# Patient Record
Sex: Female | Born: 1970 | Race: White | Hispanic: No | Marital: Single | Smoking: Never smoker
Health system: Southern US, Community
[De-identification: ages and names within clinical notes are randomized; demographics above are authoritative.]

---

## 2011-02-19 DIAGNOSIS — Z720 Tobacco use: Secondary | ICD-10-CM | POA: Insufficient documentation

## 2011-02-19 DIAGNOSIS — Z01419 Encounter for gynecological examination (general) (routine) without abnormal findings: Secondary | ICD-10-CM | POA: Insufficient documentation

## 2011-02-19 DIAGNOSIS — L989 Disorder of the skin and subcutaneous tissue, unspecified: Secondary | ICD-10-CM | POA: Insufficient documentation

## 2011-06-03 DIAGNOSIS — G44209 Tension-type headache, unspecified, not intractable: Secondary | ICD-10-CM | POA: Insufficient documentation

## 2011-08-20 DIAGNOSIS — M797 Fibromyalgia: Secondary | ICD-10-CM | POA: Insufficient documentation

## 2011-08-20 DIAGNOSIS — G43709 Chronic migraine without aura, not intractable, without status migrainosus: Secondary | ICD-10-CM | POA: Insufficient documentation

## 2011-12-06 DIAGNOSIS — S0990XA Unspecified injury of head, initial encounter: Secondary | ICD-10-CM | POA: Insufficient documentation

## 2012-06-09 DIAGNOSIS — F988 Other specified behavioral and emotional disorders with onset usually occurring in childhood and adolescence: Secondary | ICD-10-CM | POA: Insufficient documentation

## 2012-06-09 DIAGNOSIS — F319 Bipolar disorder, unspecified: Secondary | ICD-10-CM | POA: Insufficient documentation

## 2012-07-11 DIAGNOSIS — Z124 Encounter for screening for malignant neoplasm of cervix: Secondary | ICD-10-CM | POA: Insufficient documentation

## 2012-07-11 DIAGNOSIS — Z1231 Encounter for screening mammogram for malignant neoplasm of breast: Secondary | ICD-10-CM | POA: Insufficient documentation

## 2012-07-11 DIAGNOSIS — F411 Generalized anxiety disorder: Secondary | ICD-10-CM | POA: Insufficient documentation

## 2012-07-11 DIAGNOSIS — L709 Acne, unspecified: Secondary | ICD-10-CM | POA: Insufficient documentation

## 2012-07-11 DIAGNOSIS — J309 Allergic rhinitis, unspecified: Secondary | ICD-10-CM | POA: Insufficient documentation

## 2012-07-11 DIAGNOSIS — F988 Other specified behavioral and emotional disorders with onset usually occurring in childhood and adolescence: Secondary | ICD-10-CM | POA: Insufficient documentation

## 2012-07-11 DIAGNOSIS — L309 Dermatitis, unspecified: Secondary | ICD-10-CM | POA: Insufficient documentation

## 2012-07-11 DIAGNOSIS — F429 Obsessive-compulsive disorder, unspecified: Secondary | ICD-10-CM | POA: Insufficient documentation

## 2012-07-11 DIAGNOSIS — J029 Acute pharyngitis, unspecified: Secondary | ICD-10-CM | POA: Insufficient documentation

## 2012-07-16 DIAGNOSIS — M129 Arthropathy, unspecified: Secondary | ICD-10-CM | POA: Insufficient documentation

## 2012-07-16 DIAGNOSIS — R413 Other amnesia: Secondary | ICD-10-CM | POA: Insufficient documentation

## 2012-07-16 DIAGNOSIS — G894 Chronic pain syndrome: Secondary | ICD-10-CM | POA: Insufficient documentation

## 2012-07-16 DIAGNOSIS — G4700 Insomnia, unspecified: Secondary | ICD-10-CM | POA: Insufficient documentation

## 2012-07-16 DIAGNOSIS — F341 Dysthymic disorder: Secondary | ICD-10-CM | POA: Insufficient documentation

## 2012-07-16 DIAGNOSIS — G4710 Hypersomnia, unspecified: Secondary | ICD-10-CM | POA: Insufficient documentation

## 2012-07-16 DIAGNOSIS — M791 Myalgia, unspecified site: Secondary | ICD-10-CM | POA: Insufficient documentation

## 2012-07-16 DIAGNOSIS — G8921 Chronic pain due to trauma: Secondary | ICD-10-CM | POA: Insufficient documentation

## 2012-07-16 DIAGNOSIS — R635 Abnormal weight gain: Secondary | ICD-10-CM | POA: Insufficient documentation

## 2012-07-16 DIAGNOSIS — Z639 Problem related to primary support group, unspecified: Secondary | ICD-10-CM | POA: Insufficient documentation

## 2012-07-24 DIAGNOSIS — H60399 Other infective otitis externa, unspecified ear: Secondary | ICD-10-CM | POA: Insufficient documentation

## 2012-07-27 DIAGNOSIS — M25569 Pain in unspecified knee: Secondary | ICD-10-CM | POA: Insufficient documentation

## 2012-11-30 DIAGNOSIS — F411 Generalized anxiety disorder: Secondary | ICD-10-CM | POA: Insufficient documentation

## 2012-11-30 DIAGNOSIS — F4541 Pain disorder exclusively related to psychological factors: Secondary | ICD-10-CM | POA: Insufficient documentation

## 2012-11-30 DIAGNOSIS — F39 Unspecified mood [affective] disorder: Secondary | ICD-10-CM | POA: Insufficient documentation

## 2013-05-03 DIAGNOSIS — G5600 Carpal tunnel syndrome, unspecified upper limb: Secondary | ICD-10-CM | POA: Insufficient documentation

## 2013-05-03 DIAGNOSIS — M79603 Pain in arm, unspecified: Secondary | ICD-10-CM | POA: Insufficient documentation

## 2013-06-25 DIAGNOSIS — M5030 Other cervical disc degeneration, unspecified cervical region: Secondary | ICD-10-CM | POA: Insufficient documentation

## 2013-06-25 DIAGNOSIS — M5412 Radiculopathy, cervical region: Secondary | ICD-10-CM | POA: Insufficient documentation

## 2013-07-22 DIAGNOSIS — M797 Fibromyalgia: Secondary | ICD-10-CM | POA: Insufficient documentation

## 2013-09-13 DIAGNOSIS — M5412 Radiculopathy, cervical region: Secondary | ICD-10-CM | POA: Insufficient documentation

## 2014-09-12 DIAGNOSIS — S13100A Subluxation of unspecified cervical vertebrae, initial encounter: Secondary | ICD-10-CM | POA: Insufficient documentation

## 2014-11-29 DIAGNOSIS — F4310 Post-traumatic stress disorder, unspecified: Secondary | ICD-10-CM | POA: Insufficient documentation

## 2014-12-07 DIAGNOSIS — M9909 Segmental and somatic dysfunction of abdomen and other regions: Secondary | ICD-10-CM | POA: Insufficient documentation

## 2015-06-28 DIAGNOSIS — G43009 Migraine without aura, not intractable, without status migrainosus: Secondary | ICD-10-CM | POA: Insufficient documentation

## 2015-08-10 ENCOUNTER — Ambulatory Visit (INDEPENDENT_AMBULATORY_CARE_PROVIDER_SITE_OTHER): Payer: Medicaid Other | Admitting: Medical

## 2015-08-10 ENCOUNTER — Encounter (INDEPENDENT_AMBULATORY_CARE_PROVIDER_SITE_OTHER): Payer: Self-pay

## 2015-08-10 ENCOUNTER — Encounter (HOSPITAL_COMMUNITY): Payer: Self-pay | Admitting: Medical

## 2015-08-10 VITALS — BP 114/64 | HR 85 | Ht 63.0 in | Wt 116.0 lb

## 2015-08-10 DIAGNOSIS — Z8659 Personal history of other mental and behavioral disorders: Secondary | ICD-10-CM | POA: Insufficient documentation

## 2015-08-10 DIAGNOSIS — Z639 Problem related to primary support group, unspecified: Secondary | ICD-10-CM | POA: Insufficient documentation

## 2015-08-10 DIAGNOSIS — F418 Other specified anxiety disorders: Secondary | ICD-10-CM

## 2015-08-10 DIAGNOSIS — F1010 Alcohol abuse, uncomplicated: Secondary | ICD-10-CM

## 2015-08-10 DIAGNOSIS — F1011 Alcohol abuse, in remission: Secondary | ICD-10-CM

## 2015-08-10 DIAGNOSIS — Z62811 Personal history of psychological abuse in childhood: Secondary | ICD-10-CM

## 2015-08-10 DIAGNOSIS — F4312 Post-traumatic stress disorder, chronic: Secondary | ICD-10-CM

## 2015-08-10 DIAGNOSIS — R6889 Other general symptoms and signs: Secondary | ICD-10-CM | POA: Insufficient documentation

## 2015-08-10 DIAGNOSIS — G894 Chronic pain syndrome: Secondary | ICD-10-CM

## 2015-08-10 DIAGNOSIS — F4310 Post-traumatic stress disorder, unspecified: Secondary | ICD-10-CM

## 2015-08-10 DIAGNOSIS — Z79899 Other long term (current) drug therapy: Secondary | ICD-10-CM

## 2015-08-10 DIAGNOSIS — F988 Other specified behavioral and emotional disorders with onset usually occurring in childhood and adolescence: Secondary | ICD-10-CM

## 2015-08-10 NOTE — Progress Notes (Signed)
Psychiatric Initial Adult Assessment   Patient Identification: Ana Hicks MRN:  [id number] Date of Evaluation:  08/10/2015 Referral Source: Zollie Peeon Bulla PA Chief Complaint:  Wants genetic testing Chief Complaint    Establish Care; Family Problem; Alcohol Problem; Trauma; Stress; Conversion reactions; Multiple somatic complaints; Benzodiazepene dependency     Visit Diagnosis: Patient Active Problem List  Diagnosis  . Brachial neuritis or radiculitis  . Degeneration of cervical intervertebral disc  . Acne  . Acute pharyngitis  . Allergic rhinitis  . Anxiety state  . Attention deficit hyperactivity disorder (ADHD)  . Bipolar affective disorder (RAF-HCC)  . Carpal tunnel syndrome  . Encounter for screening for malignant neoplasm of cervix  . Arthropathy  . Headache  . Chronic pain syndrome  . Dysthymic disorder  . Tobacco use disorder  . Dermatitis  . Encounter for routine gynecological examination  . Episodic mood disorder (RAF-HCC)  . Organic hypersomnia  . Myalgia and myositis  . Generalized anxiety disorder  . Head injury  . Insomnia  . Pain in joint, lower leg  . Memory loss  . Obsessive-compulsive disorder  . Infective otitis externa  . Pain disorders related to psychological factors  . Chronic pain due to trauma  . Nonallopathic lesion of cervical region  . Nonallopathic lesion of lumbar region  . Disorder of skin and subcutaneous tissue  . Family circumstance  . Tension type headache  . Visit for screening mammogram  . Abnormal weight gain  . Pain in joint, forearm  . Myofascial pain syndrome  . Nonallopathic lesion of thoracic region  . Left cervical radiculopathy with cervical disc degeneration  . Fibromyalgia    History of Present Illness: 45 yo WF seeking genetic testing for her "Bipolar DO" .Staes she has been on "many medications" and had side effects. Her daughter has had testing so "I am familiar with it". [facility] at [facility] 10 years and is currently seeing Psych provider there Yvette Rackegina Mozingo NP. Saysshe requested Genetic testing but procedure is m not done there.       Current Medications Prescription Sig. Disp. Refills Start Date End Date Status  busPIRone (BUSPAR) 10 mg tablet  Take 10 mg by mouth 3 (three) times a day.      Active  topiramate (TOPAMAX) 25 MG capsule  Take 75 mg by mouth daily.     Active  diazepam (VALIUM) 5 mg tablet  1/2 day and One hs   07/19/2013  Active  gabapentin (NEURONTIN) 600 mg tablet  2 three times daily as needed   07/15/2013  Active  omeprazole (PRILOSEC) 40 mg capsule  Take 40 mg by mouth.     Active  ondansetron (ZOFRAN) 4 MG tablet  Take 4 mg by mouth.     Active  zolpidem tartrate (AMBIEN) 10 mg tablet  one hs   07/19/2013  Active  cyclobenzaprine (FLEXERIL) 5 mg tablet  Up to three times a day as needed   07/15/2013  Active  topiramate (TOPAMAX) 100 MG tablet  Take 100 mg by mouth.     Active  Dietary Management Product (ENLYTE) CAPS  Take by mouth.   10/19/2013  Active  traZODone (DESYREL) 100 MG tablet  Take by mouth.   10/19/2013  Active  ranitidine (ZANTAC) 300 MG capsule  Take 300 mg by mouth every evening.     Active  baclofen (LIORESAL) 10 mg tablet  TAKE ONE (1) TABLET BY MOUTH 3 TIMES DAILY  45 tablet  2 11/30/2014  Active  modafinil (PROVIGIL) 200 MG tablet  1 in AM and at noon as needed   10/27/2014  Active  DULoxetine HCl (CYMBALTA) 30 mg capsule  Take by mouth.   01/26/2015 01/26/2016 Active  buPROPion HCl (WELLBUTRIN XL) 150 mg 24 hr tablet  Take by mouth.   03/07/2015 03/06/2016 Active  diazepam (VALIUM) 5 mg tablet  Take one tablet twice daily.   03/07/2015  Active  Active Problems Problem Noted Date  Bipolar disorder (*) 06/09/2012  ADD (attention deficit disorder) 06/09/2012  Chronic migraine without aura, without mention of intractable migraine without mention of status  migrainosus 08/20/2011  Fibromyalgia      Associated Signs/Symptoms: Depression Symptoms:  depressed mood, feelings of worthlessness/guilt, difficulty concentrating, loss of energy/fatigue, disturbed sleep, (Hypo) Manic Symptoms:  Distractibility, Impulsivity, Irritable Mood, Labiality of Mood, Anxiety Symptoms:  Excessive Worry, Specific Phobias, Psychotic Symptoms:  NONE PTSD Symptoms:  Conversion rea;multiple somatic complaints;Victim role ("they riuned my life"ction  Past Psychiatric History: Anxiety disorder (300.00) .Attention deficit disorder with hyperactivity (314.01)  Bipolar disorder (296.80) . Episodic mood disorder (296.90) . Generalized anxiety disorder (300.02)  Obsessive compulsive disorder (300.3) . Pain disorder associated with psychological and physical factors (307.89)  Segmental and somatic dysfunction of cervical region (739.1) . Segmental and somatic dysfunction of lumbar region (739.3) . Bipolar disorder (296.80) Previous Psychotropic Medications: Yes .   Substance Abuse History in the last 12 months:  No.  Consequences of Substance Abuse: none recently-see 2012 Care everywhere note  Past Medical History:  Past Medical History  Diagnosis Date  . Chronic migraine  . Insomnia, unspecified  . Headache(784.0)  . Dysthymic disorder  . Unspecified family circumstance  Stress at home  . Arthropathy, unspecified, other specified sites  . Degeneration of cervical intervertebral disc  . Myalgia and myositis, unspecified  . Memory loss  . Chronic pain due to trauma  . Chronic pain syndrome  . Organic hypersomnia, unspecified  . Acid reflux   Past Surgical History Past Surgical History  Procedure Laterality Date  . Cesarean section  x 2   Family Psychiatric History:  : The patient describes a family history of bipolar disorder and attention deficit/hyperactivity disorder (ADHD), including a mother with bipolar disorder, maternal uncle who  died from suicide at age 33, maternal grandfather with suspected bipolar disorder, and a maternal grandmother who was described as paranoid. She has a child with attention deficit/hyperactivity disorder (ADHD), and a child with bipolar disorder.  Family History:  Family History 1. Family history of Depression  Social History:  Social History   Social History  . Marital Status: Single    Spouse Name: N/A  . Number of Children: N/A  . Years of Education: N/A   Social History Main Topics  . Smoking status: Never Smoker   . Smokeless tobacco: None  . Alcohol Use: No  . Drug Use: No  . Sexual Activity: Not Asked   Other Topics Concern  . None   Social History Narrative  . SOCIAL HISTORY: The patient has been married for approximately 20 years. She states she married the first person she fell in love with. She does not know whether she is currently in love with him, that the recent affair was impulsive and not planned. She does not appear to show remorse for her current marital status. She drinks occasional alcohol, about one drink a day. She uses cigarettes, less than 10 cigarettes a day. She denies illicit substances.  She does have a history of using clonazepam as prescription.  Denied: History of Alcohol Use  Denied: History of Drug Use  Exercising Regularly  Marital History - Divorced (V61.03)  Mental Disability:  Never A Smoker  Physical Disability:  Unemployed     Additional Social History: see narrative  Allergies:   Allergies  Allergen Reactions  . Penicillin G Anaphylaxis  . Penicillins Rash    Metabolic Disorder Labs: See labs in Care Everywhere     Current Medications: Current Outpatient Prescriptions  Medication Sig Dispense Refill  . Armodafinil 250 MG tablet     . busPIRone (BUSPAR) 10 MG tablet Take by mouth.    . cyclobenzaprine (FLEXERIL) 5 MG tablet Take 5 mg by mouth.    . diazepam (VALIUM) 5 MG tablet Take 5 mg by mouth at  bedtime.    . DULoxetine (CYMBALTA) 30 MG capsule Take 30 mg by mouth daily.    . fluticasone (FLONASE) 50 MCG/ACT nasal spray Place 1 spray into both nostrils daily.    Marland Kitchen gabapentin (NEURONTIN) 300 MG capsule Take 300 mg by mouth.    . lurasidone (LATUDA) 20 MG TABS tablet     . omeprazole (PRILOSEC) 40 MG capsule Take by mouth.    . ranitidine ([facility] RANITIDINE) 75 MG tablet Take 75 mg by mouth.    . ranitidine (ZANTAC) 300 MG tablet Take 300 mg by mouth.    . topiramate (TOPAMAX) 100 MG tablet Take 300 mg by mouth.     No current facility-administered medications for this visit.    Neurologic: Headache: Yes Seizure: Negative Paresthesias:With neck issues 2015  Musculoskeletal: Strength & Muscle Tone: within normal limits Gait & Station: normal Patient leans: N/A  Psychiatric Specialty Exam: Review of Systems  Constitutional: Positive for weight loss and malaise/fatigue. Negative for fever, chills and diaphoresis.  HENT: Negative for congestion, ear discharge, ear pain, hearing loss, nosebleeds, sore throat and tinnitus.   Eyes: Negative for blurred vision, double vision, photophobia, pain, discharge and redness.  Respiratory: Positive for cough and wheezing. Negative for hemoptysis, sputum production, shortness of breath and stridor.   Cardiovascular: Negative for chest pain, palpitations, orthopnea, claudication, leg swelling and PND.  Gastrointestinal: Positive for heartburn and abdominal pain. Negative for nausea, vomiting, diarrhea, constipation, blood in stool and melena.  Genitourinary: Negative for dysuria, urgency, frequency, hematuria and flank pain.  Musculoskeletal: Positive for myalgias, back pain, joint pain and neck pain. Negative for falls.  Skin: Negative for itching and rash.  Neurological: Positive for tingling, tremors, weakness and headaches. Negative for dizziness, sensory change, speech change, focal weakness and loss of consciousness.  Endo/Heme/Allergies:  Negative for environmental allergies and polydipsia. Does not bruise/bleed easily.  Psychiatric/Behavioral: Positive for depression and hallucinations. Negative for suicidal ideas and substance abuse. The patient is nervous/anxious and has insomnia.     Blood pressure 114/64, pulse 85, height 5\' 3"  (1.6 m), weight 116 lb (52.617 kg), last menstrual period 08/08/2015, SpO2 96 %.Body mass index is 20.55 kg/(m^2).  General Appearance: Well Groomed and asthenic  Eye Contact:  Good  Speech:  Clear and Coherent  Volume:  Normal  Mood:  Euthymic  Affect:  Congruent  Thought Process:  Goal Directed  Orientation:  Full (Time, Place, and Person)  Thought Content:  Logical, Illogical, Delusions, Obsessions and Rumination  Suicidal Thoughts:  No  Homicidal Thoughts:  No  Memory:  Negative  Judgement:  Impaired  Insight:  Lacking  Psychomotor Activity:  Normal and Restlessness  Concentration:  Concentration: Good and Attention Span: Good for visit  Recall:  Good  Fund of Knowledge:Fair  Language: Fair  Akathisia:  Negative  Handed:  Right  AIMS (if indicated):  NA  Assets:  Financial Resources/Insurance Resilience Transportation Vocational/Educational  ADL's:  Intact  Cognition: Impaired,  Severe  Sleep:  Impaired by history    Treatment Plan Summary: Reviewed pt record and reason for visit-not appropriate for her to change providers and she doesnt want to under current circumstances-she only wants Gene testing and transfer if she b cant "get on the right medications".Will perform [facility] and [facility] and the patient   Maryjean MornCharles Amarrion Pastorino, PA-C 7/6/20172:15 PM

## 2015-08-31 ENCOUNTER — Telehealth (HOSPITAL_COMMUNITY): Payer: Self-pay | Admitting: *Deleted

## 2015-08-31 NOTE — Telephone Encounter (Signed)
Received results from [facility]. Per Maryjean Morn, PA, results were faxed to pt's pcp, Dr. Malvin Johns at [phone].

## 2016-01-04 ENCOUNTER — Other Ambulatory Visit (HOSPITAL_BASED_OUTPATIENT_CLINIC_OR_DEPARTMENT_OTHER): Payer: Self-pay | Admitting: Obstetrics and Gynecology

## 2016-01-04 DIAGNOSIS — Z1231 Encounter for screening mammogram for malignant neoplasm of breast: Secondary | ICD-10-CM

## 2016-01-06 ENCOUNTER — Ambulatory Visit (HOSPITAL_BASED_OUTPATIENT_CLINIC_OR_DEPARTMENT_OTHER)
Admission: RE | Admit: 2016-01-06 | Discharge: 2016-01-06 | Disposition: A | Discharge status: Home / Self Care | Payer: Medicaid Other | Source: Ambulatory Visit | Attending: Obstetrics and Gynecology | Admitting: Obstetrics and Gynecology

## 2016-01-06 DIAGNOSIS — Z1231 Encounter for screening mammogram for malignant neoplasm of breast: Secondary | ICD-10-CM | POA: Diagnosis present

## 2016-01-06 IMAGING — MG 2D DIGITAL SCREENING BILATERAL MAMMOGRAM WITH CAD AND ADJUNCT TO
6 of 10 series · 6 of 30 positions shown · non-contrast
Comparison: Previous exam(s).

CLINICAL DATA: Screening.

EXAM:
2D DIGITAL SCREENING BILATERAL MAMMOGRAM WITH CAD AND ADJUNCT TOMO

[L MLO]
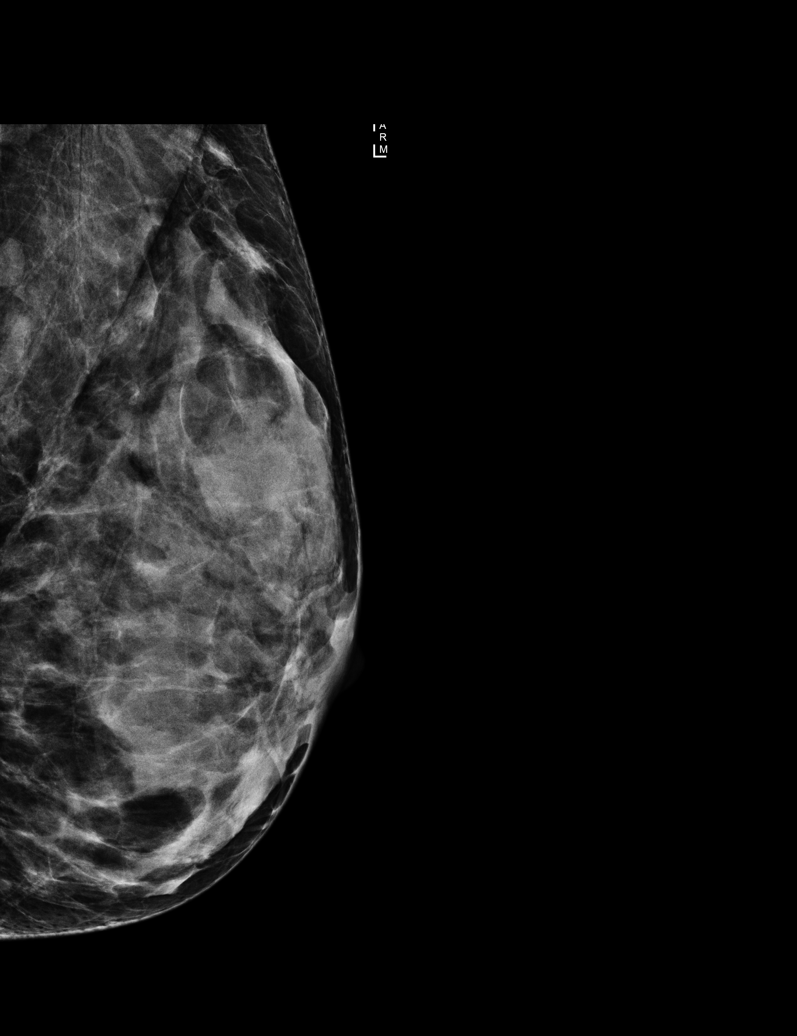

[R MLO (1 of 2)]
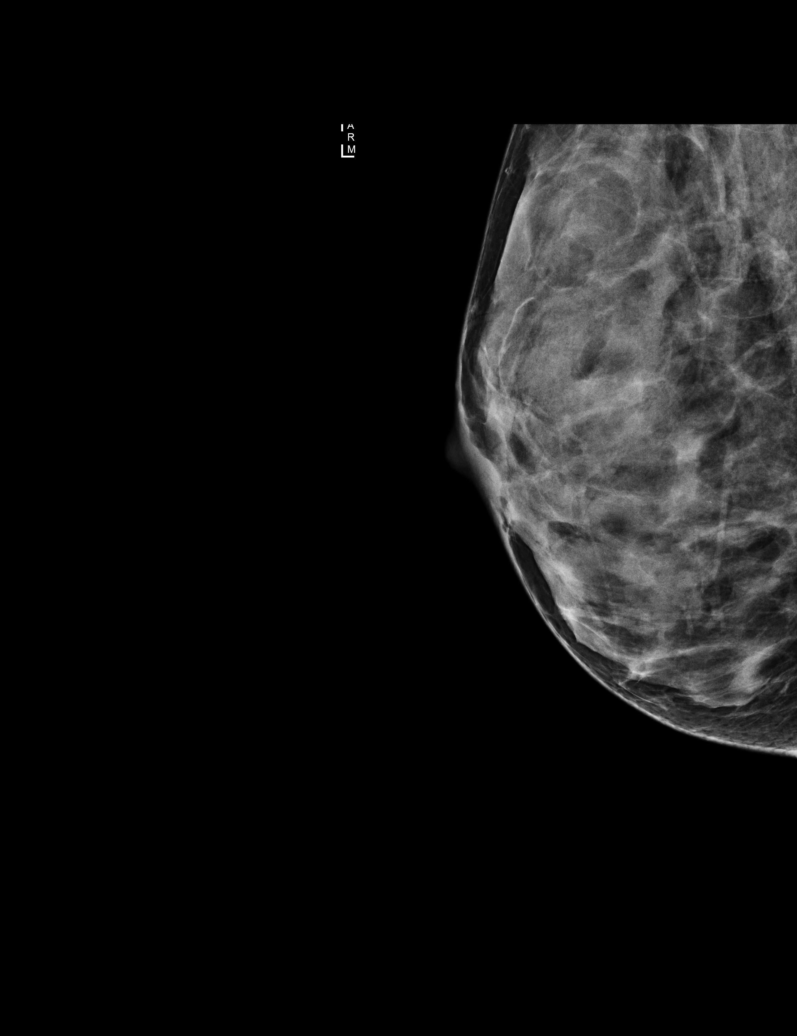

[R MLO (2 of 2)]
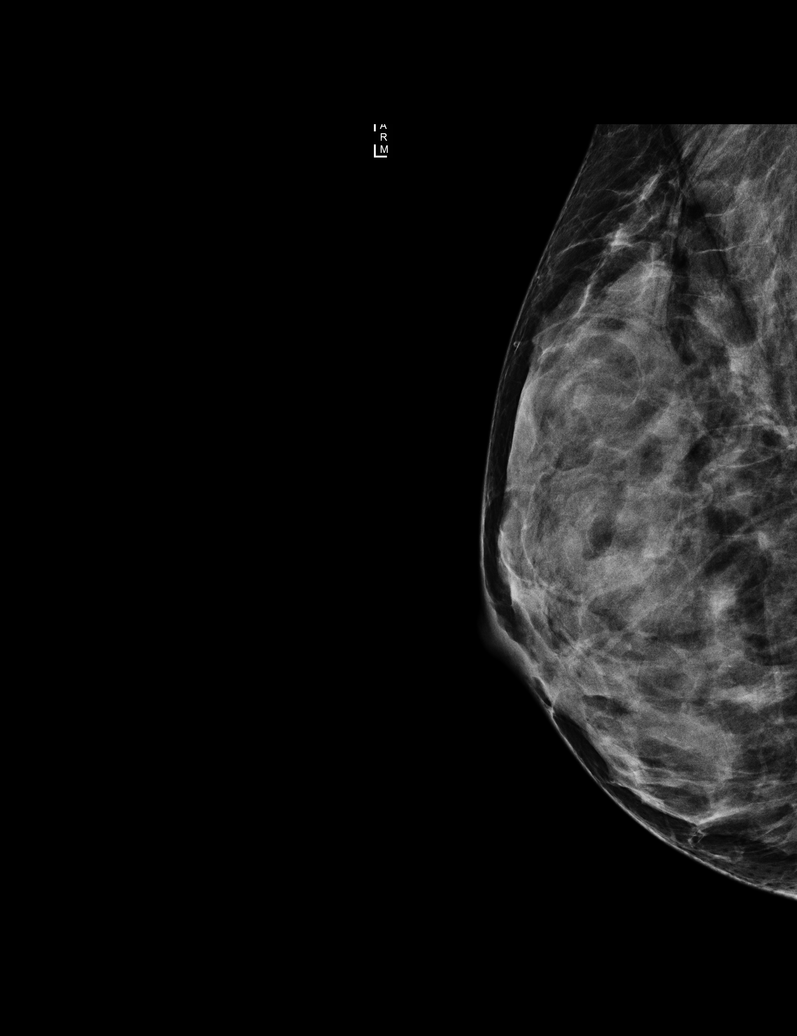

[R CC]
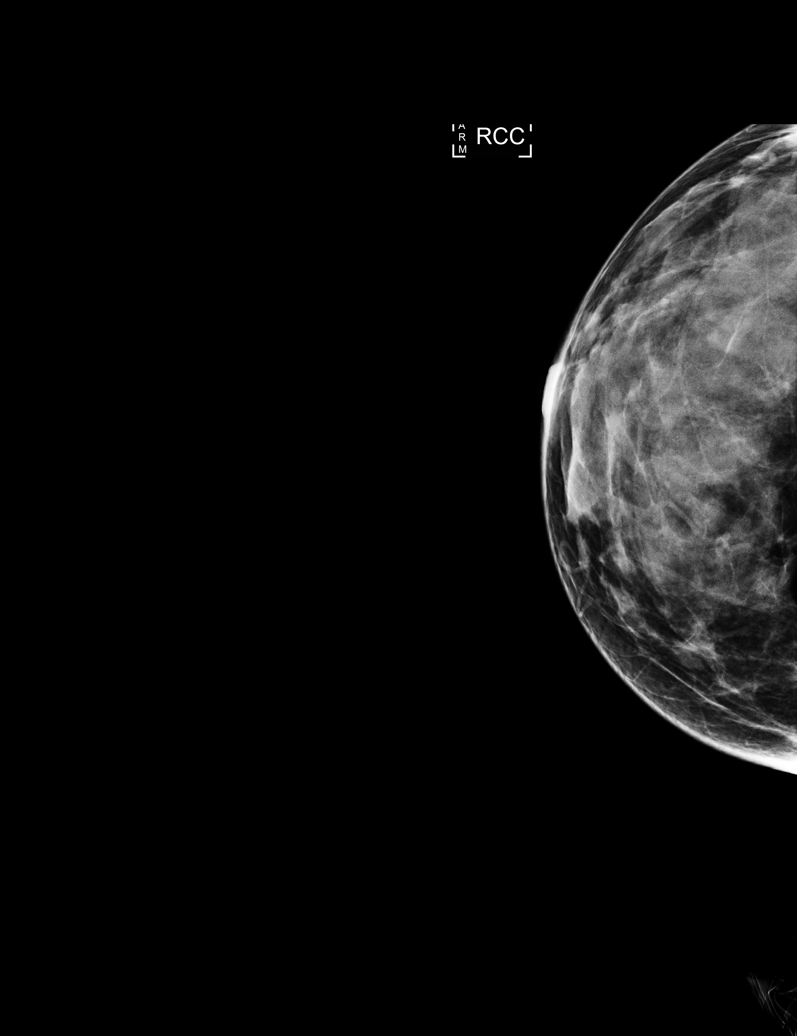

[L CC]
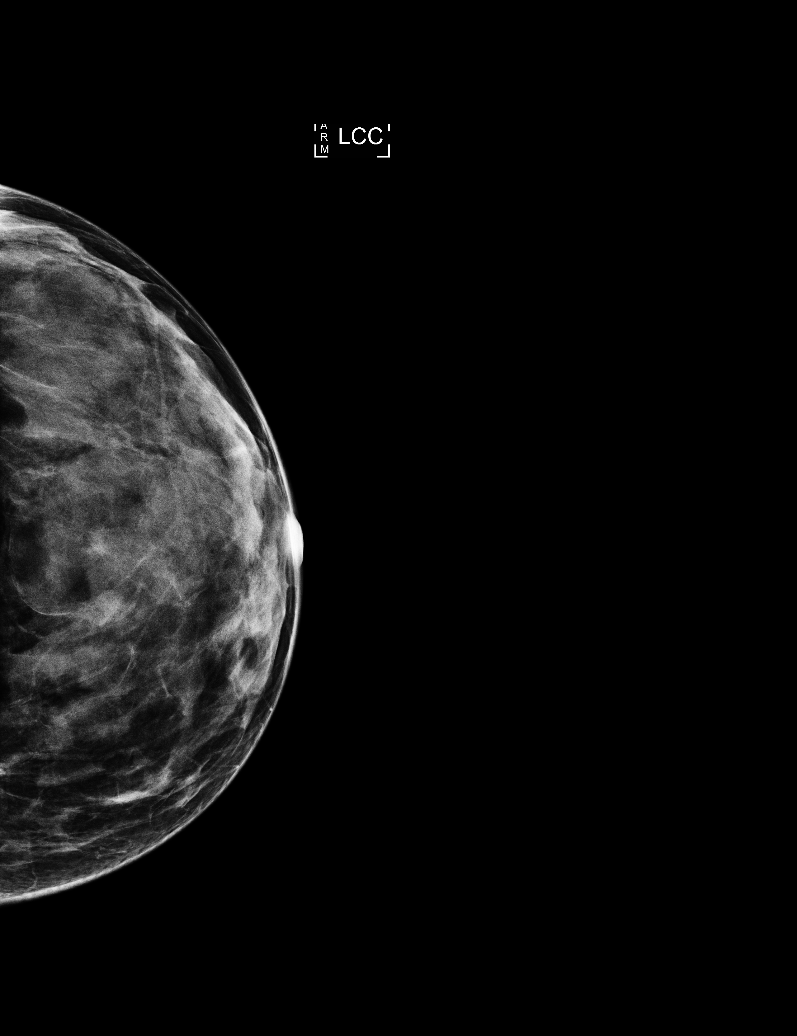

[L CC tomo · tomo slice 23/45.0]
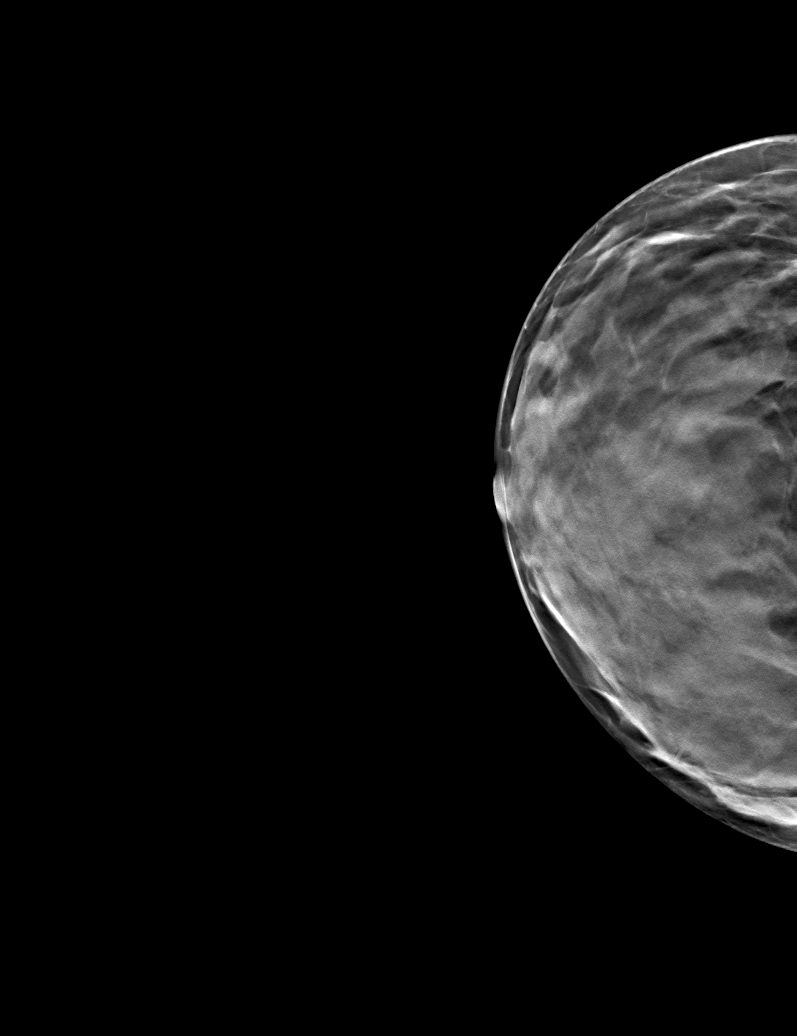

[6 of 30 positions shown; findings below may reference images not displayed]

ACR Breast Density Category d: The breast tissue is extremely dense,
which lowers the sensitivity of mammography.
FINDINGS: There are no findings suspicious for malignancy. Images were
processed with CAD.
IMPRESSION: No mammographic evidence of malignancy. A result letter of this
screening mammogram will be mailed directly to the patient.

RECOMMENDATION:
Screening mammogram in one year. (Code:[5L])

BI-RADS CATEGORY  1: Negative.

## 2020-06-21 ENCOUNTER — Ambulatory Visit: Payer: Self-pay | Admitting: Adult Health

## 2021-12-21 ENCOUNTER — Encounter: Payer: Medicaid Other | Admitting: Surgical

## 2022-05-06 DEATH — deceased
